# Patient Record
Sex: Male | Born: 1965 | Race: White | Hispanic: No | Marital: Married | Smoking: Never smoker
Health system: Southern US, Community
[De-identification: ages and names within clinical notes are randomized; demographics above are authoritative.]

## PROBLEM LIST (undated history)

## (undated) DIAGNOSIS — J329 Chronic sinusitis, unspecified: Secondary | ICD-10-CM

## (undated) DIAGNOSIS — J310 Chronic rhinitis: Secondary | ICD-10-CM

## (undated) DIAGNOSIS — R112 Nausea with vomiting, unspecified: Secondary | ICD-10-CM

## (undated) DIAGNOSIS — T8859XA Other complications of anesthesia, initial encounter: Secondary | ICD-10-CM

## (undated) DIAGNOSIS — R0683 Snoring: Secondary | ICD-10-CM

## (undated) DIAGNOSIS — Z9889 Other specified postprocedural states: Secondary | ICD-10-CM

## (undated) DIAGNOSIS — T4145XA Adverse effect of unspecified anesthetic, initial encounter: Secondary | ICD-10-CM

## (undated) DIAGNOSIS — T7840XA Allergy, unspecified, initial encounter: Secondary | ICD-10-CM

## (undated) HISTORY — PX: COLONOSCOPY: SHX174

---

## 1991-03-14 HISTORY — PX: NASAL SEPTUM SURGERY: SHX37

## 1998-03-13 HISTORY — PX: HERNIA REPAIR: SHX51

## 2017-12-24 ENCOUNTER — Other Ambulatory Visit: Payer: Self-pay | Admitting: Otolaryngology

## 2017-12-24 DIAGNOSIS — J329 Chronic sinusitis, unspecified: Secondary | ICD-10-CM

## 2017-12-28 ENCOUNTER — Ambulatory Visit
Admission: RE | Admit: 2017-12-28 | Discharge: 2017-12-28 | Disposition: A | Discharge status: Home / Self Care | Payer: 59 | Source: Ambulatory Visit | Attending: Otolaryngology | Admitting: Otolaryngology

## 2017-12-28 DIAGNOSIS — J329 Chronic sinusitis, unspecified: Secondary | ICD-10-CM

## 2018-01-09 ENCOUNTER — Other Ambulatory Visit: Payer: Self-pay

## 2018-01-09 ENCOUNTER — Encounter (HOSPITAL_BASED_OUTPATIENT_CLINIC_OR_DEPARTMENT_OTHER): Payer: Self-pay | Admitting: *Deleted

## 2018-01-15 ENCOUNTER — Ambulatory Visit: Payer: Self-pay | Admitting: Otolaryngology

## 2018-01-15 NOTE — H&P (Signed)
PREOPERATIVE H&P  Chief Complaint: frequent sinus infections and difficulty breathing through his nose especially on the left side  HPI: Ernest Calderon is a 52 y.o. male who presents for evaluation of frequent sinus infections and difficulty breathing through his nose especially on the left side.he generally has 4-5 infections per year.He had previous septoplasty performed in [place] 10 years ago.he recently had a CT scan that demonstrated mild septal deformity large turbinates and mild mucoperiosteal thickening within the sinuses. He is taken to the operating room at this time for turbinate reductions and FESS.  Past Medical History:  Diagnosis Date  . Allergies   . Chronic rhinitis   . Chronic sinusitis   . Complication of anesthesia   . PONV (postoperative nausea and vomiting)   . Snores    Past Surgical History:  Procedure Laterality Date  . COLONOSCOPY    . HERNIA REPAIR Bilateral 2000  . NASAL SEPTUM SURGERY  1993   Social History   Socioeconomic History  . Marital status: Married    Spouse name: Not on file  . Number of children: Not on file  . Years of education: Not on file  . Highest education level: Not on file  Occupational History  . Not on file  Social Needs  . Financial resource strain: Not on file  . Food insecurity:    Worry: Not on file    Inability: Not on file  . Transportation needs:    Medical: Not on file    Non-medical: Not on file  Tobacco Use  . Smoking status: Never Smoker  . Smokeless tobacco: Never Used  Substance and Sexual Activity  . Alcohol use: Never    Frequency: Never  . Drug use: Never  . Sexual activity: Not on file  Lifestyle  . Physical activity:    Days per week: Not on file    Minutes per session: Not on file  . Stress: Not on file  Relationships  . Social connections:    Talks on phone: Not on file    Gets together: Not on file    Attends religious service: Not on file    Active member of club or organization: Not on file     Attends meetings of clubs or organizations: Not on file    Relationship status: Not on file  Other Topics Concern  . Not on file  Social History Narrative  . Not on file   History reviewed. No pertinent family history. Allergies  Allergen Reactions  . Avelox [Moxifloxacin] Other (See Comments)    arthralgia  . Ibuprofen Swelling  . Levofloxacin Other (See Comments)    arthralgia  . Loracarbef Other (See Comments)    GI upset, made IBS worse  . Penicillins Rash    Can take Amoxicillin and Omnicef   Prior to Admission medications   Medication Sig Start Date End Date Taking? Authorizing Provider  Coenzyme Q10 (CO Q-10) 100 MG CAPS Take by mouth.   Yes [provider]  diphenhydrAMINE (BENADRYL) 25 mg capsule Take 25 mg by mouth every 6 (six) hours as needed.   Yes [provider]  loratadine (CLARITIN) 10 MG tablet Take 10 mg by mouth daily.   Yes [provider]  Red Yeast Rice 600 MG TABS Take by mouth.   Yes [provider]  Resveratrol 250 MG CAPS Take by mouth.   Yes [provider]     Positive ROS: per history of present illness  All other systems  have been reviewed and were otherwise negative with the exception of those mentioned in the HPI and as above.  Physical Exam: There were no vitals filed for this visit.  General: Alert, no acute distress Oral: Normal oral mucosa and tonsils Nasal: mild septal deformity to the left. Large inferior turbinates. No polyps noted. Neck: No palpable adenopathy or thyroid nodules Ear: Ear canal is clear with normal appearing TMs Cardiovascular: Regular rate and rhythm, no murmur.  Respiratory: Clear to auscultation Neurologic: Alert and oriented x 3   Assessment/Plan: CHRONIC RHINITIS,Turbinate hypertrophy. History of recurrent sinusitis. Plan for Procedure(s): BILATERAL TURBINATE REDUCTION ETHMOIDECTOMY MAXILLARY ENLARGEMENT   Dillard Cannon, MD 01/15/2018 5:09 PM

## 2018-01-18 ENCOUNTER — Encounter (HOSPITAL_BASED_OUTPATIENT_CLINIC_OR_DEPARTMENT_OTHER)
Admission: RE | Disposition: A | Discharge status: Home / Self Care | Payer: Self-pay | Source: Ambulatory Visit | Attending: Otolaryngology

## 2018-01-18 ENCOUNTER — Other Ambulatory Visit: Payer: Self-pay

## 2018-01-18 ENCOUNTER — Encounter (HOSPITAL_BASED_OUTPATIENT_CLINIC_OR_DEPARTMENT_OTHER): Payer: Self-pay | Admitting: Anesthesiology

## 2018-01-18 ENCOUNTER — Ambulatory Visit (HOSPITAL_BASED_OUTPATIENT_CLINIC_OR_DEPARTMENT_OTHER): Payer: 59 | Admitting: Anesthesiology

## 2018-01-18 ENCOUNTER — Ambulatory Visit (HOSPITAL_BASED_OUTPATIENT_CLINIC_OR_DEPARTMENT_OTHER)
Admission: RE | Admit: 2018-01-18 | Discharge: 2018-01-18 | Disposition: A | Discharge status: Home / Self Care | Payer: 59 | Source: Ambulatory Visit | Attending: Otolaryngology | Admitting: Otolaryngology

## 2018-01-18 DIAGNOSIS — J329 Chronic sinusitis, unspecified: Secondary | ICD-10-CM | POA: Diagnosis not present

## 2018-01-18 DIAGNOSIS — J343 Hypertrophy of nasal turbinates: Secondary | ICD-10-CM | POA: Diagnosis not present

## 2018-01-18 DIAGNOSIS — J310 Chronic rhinitis: Secondary | ICD-10-CM | POA: Diagnosis present

## 2018-01-18 DIAGNOSIS — Z79899 Other long term (current) drug therapy: Secondary | ICD-10-CM | POA: Diagnosis not present

## 2018-01-18 HISTORY — PX: ETHMOIDECTOMY: SHX5197

## 2018-01-18 HISTORY — PX: MAXILLARY ANTROSTOMY: SHX2003

## 2018-01-18 HISTORY — DX: Chronic rhinitis: J31.0

## 2018-01-18 HISTORY — DX: Other complications of anesthesia, initial encounter: T88.59XA

## 2018-01-18 HISTORY — DX: Other specified postprocedural states: Z98.890

## 2018-01-18 HISTORY — PX: TURBINATE REDUCTION: SHX6157

## 2018-01-18 HISTORY — DX: Adverse effect of unspecified anesthetic, initial encounter: T41.45XA

## 2018-01-18 HISTORY — DX: Chronic sinusitis, unspecified: J32.9

## 2018-01-18 HISTORY — DX: Allergy, unspecified, initial encounter: T78.40XA

## 2018-01-18 HISTORY — DX: Snoring: R06.83

## 2018-01-18 HISTORY — DX: Other specified postprocedural states: R11.2

## 2018-01-18 SURGERY — REDUCTION, NASAL TURBINATE
Anesthesia: General | Laterality: Bilateral

## 2018-01-18 MED ORDER — OXYMETAZOLINE HCL 0.05 % NA SOLN
NASAL | Status: DC | PRN
  Administered 2018-01-18: 1 via TOPICAL

## 2018-01-18 MED ORDER — OXYCODONE HCL 5 MG/5ML PO SOLN: 5.0000 mg | Freq: Once | ORAL | Status: DC | PRN

## 2018-01-18 MED ORDER — FENTANYL CITRATE (PF) 100 MCG/2ML IJ SOLN
INTRAMUSCULAR | Status: AC
  Filled 2018-01-18: qty 2

## 2018-01-18 MED ORDER — ROCURONIUM BROMIDE 50 MG/5ML IV SOSY
PREFILLED_SYRINGE | INTRAVENOUS | Status: AC
  Filled 2018-01-18: qty 5

## 2018-01-18 MED ORDER — SODIUM CHLORIDE 0.9 % IV SOLN
INTRAVENOUS | Status: AC | PRN
  Administered 2018-01-18: 500 mL via INTRAMUSCULAR

## 2018-01-18 MED ORDER — ONDANSETRON HCL 4 MG/2ML IJ SOLN
INTRAMUSCULAR | Status: AC
  Filled 2018-01-18: qty 2

## 2018-01-18 MED ORDER — ROCURONIUM BROMIDE 100 MG/10ML IV SOLN
INTRAVENOUS | Status: DC | PRN
  Administered 2018-01-18: 50 mg via INTRAVENOUS

## 2018-01-18 MED ORDER — LIDOCAINE HCL (CARDIAC) PF 100 MG/5ML IV SOSY
PREFILLED_SYRINGE | INTRAVENOUS | Status: DC | PRN
  Administered 2018-01-18: 100 mg via INTRAVENOUS

## 2018-01-18 MED ORDER — METHYLPREDNISOLONE ACETATE 80 MG/ML IJ SUSP
INTRAMUSCULAR | Status: AC
  Filled 2018-01-18: qty 1

## 2018-01-18 MED ORDER — PROMETHAZINE HCL 25 MG/ML IJ SOLN
INTRAMUSCULAR | Status: AC
  Filled 2018-01-18: qty 1

## 2018-01-18 MED ORDER — DEXAMETHASONE SODIUM PHOSPHATE 10 MG/ML IJ SOLN
INTRAMUSCULAR | Status: AC
  Filled 2018-01-18: qty 1

## 2018-01-18 MED ORDER — LIDOCAINE-EPINEPHRINE 1 %-1:100000 IJ SOLN
INTRAMUSCULAR | Status: AC
  Filled 2018-01-18: qty 1

## 2018-01-18 MED ORDER — MUPIROCIN CALCIUM 2 % EX CREA
TOPICAL_CREAM | CUTANEOUS | Status: AC
  Filled 2018-01-18: qty 15

## 2018-01-18 MED ORDER — ARTIFICIAL TEARS OPHTHALMIC OINT
TOPICAL_OINTMENT | OPHTHALMIC | Status: AC
  Filled 2018-01-18: qty 3.5

## 2018-01-18 MED ORDER — PROPOFOL 10 MG/ML IV BOLUS
INTRAVENOUS | Status: DC | PRN
  Administered 2018-01-18: 200 mg via INTRAVENOUS

## 2018-01-18 MED ORDER — MIDAZOLAM HCL 2 MG/2ML IJ SOLN
INTRAMUSCULAR | Status: AC
  Filled 2018-01-18: qty 2

## 2018-01-18 MED ORDER — FENTANYL CITRATE (PF) 100 MCG/2ML IJ SOLN: 50.0000 ug | INTRAMUSCULAR | Status: DC | PRN

## 2018-01-18 MED ORDER — CEFAZOLIN SODIUM-DEXTROSE 2-4 GM/100ML-% IV SOLN
INTRAVENOUS | Status: AC
  Filled 2018-01-18: qty 100

## 2018-01-18 MED ORDER — SCOPOLAMINE 1 MG/3DAYS TD PT72
1.0000 | MEDICATED_PATCH | Freq: Once | TRANSDERMAL | Status: AC | PRN
  Administered 2018-01-18: 1 via TRANSDERMAL

## 2018-01-18 MED ORDER — SCOPOLAMINE 1 MG/3DAYS TD PT72
MEDICATED_PATCH | TRANSDERMAL | Status: AC
  Filled 2018-01-18: qty 1

## 2018-01-18 MED ORDER — PHENYLEPHRINE HCL 10 MG/ML IJ SOLN
INTRAMUSCULAR | Status: DC | PRN
  Administered 2018-01-18: 120 ug via INTRAVENOUS

## 2018-01-18 MED ORDER — METHYLPREDNISOLONE ACETATE 80 MG/ML IJ SUSP
INTRAMUSCULAR | Status: DC | PRN
  Administered 2018-01-18: 80 mg

## 2018-01-18 MED ORDER — CEPHALEXIN 500 MG PO CAPS: 500.0000 mg | ORAL_CAPSULE | Freq: Two times a day (BID) | ORAL | 0 refills | Status: AC

## 2018-01-18 MED ORDER — LIDOCAINE 2% (20 MG/ML) 5 ML SYRINGE
INTRAMUSCULAR | Status: AC
  Filled 2018-01-18: qty 5

## 2018-01-18 MED ORDER — OXYCODONE HCL 5 MG PO TABS: 5.0000 mg | ORAL_TABLET | Freq: Once | ORAL | Status: DC | PRN

## 2018-01-18 MED ORDER — DEXAMETHASONE SODIUM PHOSPHATE 4 MG/ML IJ SOLN
INTRAMUSCULAR | Status: DC | PRN
  Administered 2018-01-18: 10 mg via INTRAVENOUS

## 2018-01-18 MED ORDER — CHLORHEXIDINE GLUCONATE CLOTH 2 % EX PADS: 6.0000 | MEDICATED_PAD | Freq: Once | CUTANEOUS | Status: DC

## 2018-01-18 MED ORDER — MIDAZOLAM HCL 2 MG/2ML IJ SOLN: 1.0000 mg | INTRAMUSCULAR | Status: DC | PRN

## 2018-01-18 MED ORDER — OXYMETAZOLINE HCL 0.05 % NA SOLN
NASAL | Status: AC
  Filled 2018-01-18: qty 15

## 2018-01-18 MED ORDER — HYDROMORPHONE HCL 1 MG/ML IJ SOLN: 0.2500 mg | INTRAMUSCULAR | Status: DC | PRN

## 2018-01-18 MED ORDER — ONDANSETRON HCL 4 MG/2ML IJ SOLN
INTRAMUSCULAR | Status: DC | PRN
  Administered 2018-01-18: 4 mg via INTRAVENOUS

## 2018-01-18 MED ORDER — LIDOCAINE-EPINEPHRINE 1 %-1:100000 IJ SOLN
INTRAMUSCULAR | Status: DC | PRN
  Administered 2018-01-18: 9 mL

## 2018-01-18 MED ORDER — LACTATED RINGERS IV SOLN
INTRAVENOUS | Status: DC
  Administered 2018-01-18 (×3): via INTRAVENOUS

## 2018-01-18 MED ORDER — SODIUM CHLORIDE (PF) 0.9 % IJ SOLN
INTRAMUSCULAR | Status: AC
  Filled 2018-01-18: qty 10

## 2018-01-18 MED ORDER — CEFAZOLIN SODIUM-DEXTROSE 2-4 GM/100ML-% IV SOLN
2.0000 g | INTRAVENOUS | Status: AC
  Administered 2018-01-18: 2 g via INTRAVENOUS

## 2018-01-18 MED ORDER — PROPOFOL 500 MG/50ML IV EMUL
INTRAVENOUS | Status: AC
  Filled 2018-01-18: qty 50

## 2018-01-18 MED ORDER — MUPIROCIN 2 % EX OINT
TOPICAL_OINTMENT | CUTANEOUS | Status: DC | PRN
  Administered 2018-01-18: 1 via NASAL

## 2018-01-18 MED ORDER — HYDROCODONE-ACETAMINOPHEN 5-325 MG PO TABS: 1.0000 | ORAL_TABLET | Freq: Four times a day (QID) | ORAL | 0 refills | Status: AC | PRN

## 2018-01-18 MED ORDER — LACTATED RINGERS IV SOLN: INTRAVENOUS | Status: DC

## 2018-01-18 MED ORDER — MIDAZOLAM HCL 5 MG/5ML IJ SOLN
INTRAMUSCULAR | Status: DC | PRN
  Administered 2018-01-18: 2 mg via INTRAVENOUS

## 2018-01-18 MED ORDER — FENTANYL CITRATE (PF) 100 MCG/2ML IJ SOLN
INTRAMUSCULAR | Status: DC | PRN
  Administered 2018-01-18 (×2): 25 ug via INTRAVENOUS
  Administered 2018-01-18: 100 ug via INTRAVENOUS
  Administered 2018-01-18: 50 ug via INTRAVENOUS

## 2018-01-18 MED ORDER — MEPERIDINE HCL 25 MG/ML IJ SOLN: 6.2500 mg | INTRAMUSCULAR | Status: DC | PRN

## 2018-01-18 MED ORDER — PROMETHAZINE HCL 25 MG/ML IJ SOLN
6.2500 mg | INTRAMUSCULAR | Status: DC | PRN
  Administered 2018-01-18: 6.25 mg via INTRAVENOUS

## 2018-01-18 SURGICAL SUPPLY — 66 items
APPLICATOR COTTON TIP 6 STRL (MISCELLANEOUS) IMPLANT
APPLICATOR COTTON TIP 6IN STRL (MISCELLANEOUS)
ATTRACTOMAT 16X20 MAGNETIC DRP (DRAPES) ×3 IMPLANT
BLADE INF TURB ROT M4 2 5PK (BLADE) ×2 IMPLANT
BLADE INF TURB ROT M4 2MM 5PK (BLADE) ×1
BLADE RAD40 ROTATE 4M 4 5PK (BLADE) IMPLANT
BLADE RAD40 ROTATE 4M 4MM 5PK (BLADE)
BLADE RAD60 ROTATE M4 4 5PK (BLADE) IMPLANT
BLADE RAD60 ROTATE M4 4MM 5PK (BLADE)
BLADE SURG 15 STRL LF DISP TIS (BLADE) ×1 IMPLANT
BLADE SURG 15 STRL SS (BLADE) ×2
BLADE TRICUT ROTATE M4 4 5PK (BLADE) ×2 IMPLANT
BLADE TRICUT ROTATE M4 4MM 5PK (BLADE) ×1
BLANKET WARM LOWER BOD BAIR (MISCELLANEOUS) ×3 IMPLANT
BUR HS RAD FRONTAL 3 (BURR) IMPLANT
BUR HS RAD FRONTAL 3MM (BURR)
CANISTER SUC SOCK COL 7IN (MISCELLANEOUS) IMPLANT
CANISTER SUCT 1200ML W/VALVE (MISCELLANEOUS) ×3 IMPLANT
CLEANER CAUTERY TIP 5X5 PAD (MISCELLANEOUS) IMPLANT
COAGULATOR SUCT 8FR VV (MISCELLANEOUS) ×3 IMPLANT
COVER MAYO STAND STRL (DRAPES) ×3 IMPLANT
COVER WAND RF STERILE (DRAPES) IMPLANT
DECANTER SPIKE VIAL GLASS SM (MISCELLANEOUS) IMPLANT
DRESSING ADAPTIC 1/2  N-ADH (PACKING) IMPLANT
DRESSING NASAL KENNEDY 3.5X.9 (MISCELLANEOUS) IMPLANT
DRSG NASAL KENNEDY 3.5X.9 (MISCELLANEOUS)
DRSG NASAL KENNEDY LMNT 8CM (GAUZE/BANDAGES/DRESSINGS) IMPLANT
DRSG NASOPORE 8CM (GAUZE/BANDAGES/DRESSINGS) IMPLANT
DRSG TELFA 3X8 NADH (GAUZE/BANDAGES/DRESSINGS) IMPLANT
ELECT COATED BLADE 2.86 ST (ELECTRODE) ×3 IMPLANT
ELECT REM PT RETURN 9FT ADLT (ELECTROSURGICAL) ×3
ELECTRODE REM PT RTRN 9FT ADLT (ELECTROSURGICAL) ×1 IMPLANT
GLOVE SS BIOGEL STRL SZ 7.5 (GLOVE) ×1 IMPLANT
GLOVE SUPERSENSE BIOGEL SZ 7.5 (GLOVE) ×2
GOWN STRL REIN XL XLG (GOWN DISPOSABLE) ×3 IMPLANT
GOWN STRL REUS W/ TWL LRG LVL3 (GOWN DISPOSABLE) ×2 IMPLANT
GOWN STRL REUS W/ TWL XL LVL3 (GOWN DISPOSABLE) ×1 IMPLANT
GOWN STRL REUS W/TWL LRG LVL3 (GOWN DISPOSABLE) ×4
GOWN STRL REUS W/TWL XL LVL3 (GOWN DISPOSABLE) ×2
HEMOSTAT SURGICEL .5X2 ABSORB (HEMOSTASIS) IMPLANT
HEMOSTAT SURGICEL 2X14 (HEMOSTASIS) IMPLANT
IV NS 500ML (IV SOLUTION) ×2
IV NS 500ML BAXH (IV SOLUTION) ×1 IMPLANT
NEEDLE PRECISIONGLIDE 27X1.5 (NEEDLE) ×6 IMPLANT
NEEDLE SPNL 25GX3.5 QUINCKE BL (NEEDLE) IMPLANT
NS IRRIG 1000ML POUR BTL (IV SOLUTION) ×3 IMPLANT
PACK BASIN DAY SURGERY FS (CUSTOM PROCEDURE TRAY) ×3 IMPLANT
PACK ENT DAY SURGERY (CUSTOM PROCEDURE TRAY) ×3 IMPLANT
PAD CLEANER CAUTERY TIP 5X5 (MISCELLANEOUS)
PATTIES SURGICAL .5 X3 (DISPOSABLE) ×3 IMPLANT
PENCIL BUTTON HOLSTER BLD 10FT (ELECTRODE) ×3 IMPLANT
SHEET MEDIUM DRAPE 40X70 STRL (DRAPES) ×3 IMPLANT
SLEEVE SCD COMPRESS KNEE MED (MISCELLANEOUS) ×3 IMPLANT
SOLUTION ANTI FOG 6CC (MISCELLANEOUS) ×3 IMPLANT
SPONGE GAUZE 2X2 8PLY STER LF (GAUZE/BANDAGES/DRESSINGS) ×1
SPONGE GAUZE 2X2 8PLY STRL LF (GAUZE/BANDAGES/DRESSINGS) ×2 IMPLANT
SUT CHROMIC 4 0 PS 2 18 (SUTURE) IMPLANT
SUT ETHILON 3 0 PS 1 (SUTURE) IMPLANT
SUT SILK 2 0 PERMA HAND 18 BK (SUTURE) IMPLANT
SYR 3ML 18GX1 1/2 (SYRINGE) ×3 IMPLANT
SYR CONTROL 10ML LL (SYRINGE) ×3 IMPLANT
TOWEL GREEN STERILE FF (TOWEL DISPOSABLE) ×6 IMPLANT
TRAY DSU PREP LF (CUSTOM PROCEDURE TRAY) ×3 IMPLANT
TUBE CONNECTING 20'X1/4 (TUBING) ×2
TUBE CONNECTING 20X1/4 (TUBING) ×4 IMPLANT
YANKAUER SUCT BULB TIP NO VENT (SUCTIONS) ×3 IMPLANT

## 2018-01-18 NOTE — Op Note (Signed)
NAME: Ernest Calderon, Ernest Calderon MEDICAL RECORD ZO:[id number] ACCOUNT [id number] DATE OF BIRTH:03/05/1966 FACILITY: [facility] LOCATION: [facility] PHYSICIAN:CHRISTOPHER Braxton Feathers, MD  OPERATIVE REPORT  DATE OF PROCEDURE:  01/18/2018  PREOPERATIVE DIAGNOSES:  Chronic rhinitis with turbinate hypertrophy and recurrent sinusitis.  POSTOPERATIVE DIAGNOSES:  Chronic rhinitis with turbinate hypertrophy and recurrent sinusitis.  OPERATIONS PERFORMED: 1.  Bilateral inferior turbinate reductions with Medtronic turbinate blade.   2.  Functional endoscopic sinus surgery with bilateral anterior ethmoidectomy and bilateral maxillary ostium enlargement.  SURGEON:  Dillard Cannon, MD  ANESTHESIA:  General endotracheal.  COMPLICATIONS:  None.  ESTIMATED BLOOD LOSS:  80 mL.  BRIEF CLINICAL NOTE:  The patient  is a 52 year old gentleman who has had a previous surgery with septoplasty and turbinate reductions performed 10 years ago in [place].  He has had progressive nasal obstruction as well as history of recurrent sinus  infections.  He estimates he gets sinus infections 4 to 5 times a year.  He has trouble breathing through his nose, especially on the left side.  On exam and CT scan, he had large turbinates with a diffuse rhinitis.  He had mild mucoperiosteal thickening  within the ethmoid and maxillary sinuses.  He is taken to the operating room at this time for turbinate reductions as well as limited functional endoscopic sinus surgery.  DESCRIPTION OF PROCEDURE:  After adequate endotracheal anesthesia, the patient received 2 grams of Ancef IV preoperatively as well as 10 mg of Decadron.  The nose and face was prepped with Betadine solution and draped in sterile towels.  The nose was  then examined and prepped with a cotton pledget soaked in Afrin to decongest the nose.  The inferior turbinates and middle turbinate region was injected with Xylocaine with epinephrine for hemostasis.  First, the right side was  approached.  The middle  turbinate was fractured medially.  Uncinate process was incised sharply with a sickle knife and removed.  The maxillary ostia was identified on the right side and was enlarged with backbiting and straight Tru-Cut forceps to approximately 1 cm size.  The  maxillary sinus was relatively clear on exam with a 30-degree endoscope.  Anterior ethmoid cells were opened up with a straight through cup and up cutting forceps and microdebrider.  This completed the right side.  On the left side,  a similar approach  was performed.  The middle turbinate was displaced medially.  Uncinate process was incised with a sickle knife.  The anterior and a few of the posterior ethmoid cells were opened up with a straight Tru-Cut forceps as well as a microdebrider.  Maxillary  ostia was identified and was enlarged with backbiting and straight Tru-Cut forceps.  This was enlarged slightly larger than the right side to approximately 1-1/4 cm.  This completed the sinus procedure on the left side.  Following completion of the sinus  procedures, the Medtronic turbinate blade was used to perform submucosal reduction of the turbinate tissue bilaterally.  The turbinates were outfractured and hemostasis was obtained with suction cautery.  This completed the procedure.  The middle meatus  region was packed with a Nasopore soaked in mupirocin ointment bilaterally.  In addition, 80 mg of Depo-Medrol were injected predominantly more on the left side than the right.    The patient was awoken from anesthesia and transferred to recovery room  postoperatively doing well.  DISPOSITION:  The patient will be discharged home later this morning on Keflex 500 mg b.i.d. for 1 week.  Tylenol, ibuprofen and hydrocodone p.r.n.  pain.  He will have follow up in my office in 5 to 7 days for recheck.  AN/NUANCE  D:01/18/2018 T:01/18/2018 JOB:[id number]

## 2018-01-18 NOTE — Anesthesia Postprocedure Evaluation (Signed)
Anesthesia Post Note  Patient: Ernest Calderon  Procedure(s) Performed: BILATERAL TURBINATE REDUCTION (Bilateral ) ETHMOIDECTOMY (Bilateral ) MAXILLARY ENLARGEMENT (Bilateral )     Patient location during evaluation: PACU Anesthesia Type: General Level of consciousness: awake and alert Pain management: pain level controlled Vital Signs Assessment: post-procedure vital signs reviewed and stable Respiratory status: spontaneous breathing, nonlabored ventilation, respiratory function stable and patient connected to nasal cannula oxygen Cardiovascular status: blood pressure returned to baseline and stable Postop Assessment: no apparent nausea or vomiting Anesthetic complications: no    Last Vitals:  Vitals:   01/18/18 1115 01/18/18 1130  BP: 116/82 124/73  Pulse: 83 84  Resp: 15 18  Temp:  36.7 C  SpO2: 94% 95%    Last Pain:  Vitals:   01/18/18 1130  TempSrc:   PainSc: 0-No pain                 Shelton Silvas

## 2018-01-18 NOTE — Anesthesia Postprocedure Evaluation (Deleted)
Anesthesia Post Note  Patient: Ernest Calderon  Procedure(s) Performed: BILATERAL TURBINATE REDUCTION (Bilateral ) ETHMOIDECTOMY (Bilateral ) MAXILLARY ENLARGEMENT (Bilateral )     Patient location during evaluation: PACU Anesthesia Type: General Level of consciousness: awake and alert Pain management: pain level controlled Vital Signs Assessment: post-procedure vital signs reviewed and stable Respiratory status: spontaneous breathing, nonlabored ventilation, respiratory function stable and patient connected to nasal cannula oxygen Cardiovascular status: blood pressure returned to baseline and stable Postop Assessment: no apparent nausea or vomiting Anesthetic complications: no    Last Vitals:  Vitals:   01/18/18 1050 01/18/18 1100  BP:  121/87  Pulse: 82 94  Resp: 13 15  Temp:    SpO2: 99% 93%    Last Pain:  Vitals:   01/18/18 1100  TempSrc:   PainSc: 0-No pain                 Shelton Silvas

## 2018-01-18 NOTE — Interval H&P Note (Signed)
History and Physical Interval Note:  01/18/2018 7:30 AM  [name]  has presented today for surgery, with the diagnosis of CHRONIC RHINITIS,DEVIATED SEPTUM  The various methods of treatment have been discussed with the patient and family. After consideration of risks, benefits and other options for treatment, the patient has consented to  Procedure(s): BILATERAL TURBINATE REDUCTION (Bilateral) ETHMOIDECTOMY (Bilateral) MAXILLARY ENLARGEMENT (Bilateral) as a surgical intervention .  The patient's history has been reviewed, patient examined, no change in status, stable for surgery.  I have reviewed the patient's chart and labs.  Questions were answered to the patient's satisfaction.     Ernest Calderon

## 2018-01-18 NOTE — Anesthesia Preprocedure Evaluation (Addendum)
Anesthesia Evaluation  Patient identified by MRN, date of birth, ID band Patient awake    Reviewed: Allergy & Precautions, NPO status , Patient's Chart, lab work & pertinent test results  History of Anesthesia Complications (+) PONV  Airway Mallampati: I  TM Distance: >3 FB Neck ROM: Full    Dental  (+) Teeth Intact, Dental Advisory Given   Pulmonary neg pulmonary ROS,    Pulmonary exam normal        Cardiovascular negative cardio ROS   Rhythm:Regular Rate:Normal     Neuro/Psych negative neurological ROS     GI/Hepatic negative GI ROS, Neg liver ROS,   Endo/Other  negative endocrine ROS  Renal/GU negative Renal ROS     Musculoskeletal negative musculoskeletal ROS (+)   Abdominal Normal abdominal exam  (+)   Peds  Hematology negative hematology ROS (+)   Anesthesia Other Findings   Reproductive/Obstetrics                            Anesthesia Physical Anesthesia Plan  ASA: II  Anesthesia Plan: General   Post-op Pain Management:    Induction: Intravenous  PONV Risk Score and Plan: 4 or greater and Ondansetron, Dexamethasone, Midazolam and Scopolamine patch - Pre-op  Airway Management Planned: Oral ETT  Additional Equipment: None  Intra-op Plan:   Post-operative Plan: Extubation in OR  Informed Consent: I have reviewed the patients History and Physical, chart, labs and discussed the procedure including the risks, benefits and alternatives for the proposed anesthesia with the patient or authorized representative who has indicated his/her understanding and acceptance.   Dental advisory given  Plan Discussed with: CRNA  Anesthesia Plan Comments:        Anesthesia Quick Evaluation

## 2018-01-18 NOTE — Anesthesia Procedure Notes (Signed)
Procedure Name: Intubation Date/Time: 01/18/2018 7:44 AM Performed by: Gar Gibbon, CRNA Pre-anesthesia Checklist: Patient identified, Emergency Drugs available, Suction available and Patient being monitored Patient Re-evaluated:Patient Re-evaluated prior to induction Oxygen Delivery Method: Circle system utilized Preoxygenation: Pre-oxygenation with 100% oxygen Induction Type: IV induction Ventilation: Mask ventilation without difficulty Laryngoscope Size: Miller and 3 Grade View: Grade II Tube type: Oral Rae Number of attempts: 1 Airway Equipment and Method: Stylet and Oral airway Placement Confirmation: ETT inserted through vocal cords under direct vision,  positive ETCO2 and breath sounds checked- equal and bilateral Secured at: 22 cm Tube secured with: Tape Dental Injury: Teeth and Oropharynx as per pre-operative assessment

## 2018-01-18 NOTE — Brief Op Note (Signed)
01/18/2018  10:05 AM  PATIENT:  Ernest Calderon  52 y.o. male  PRE-OPERATIVE DIAGNOSIS:  CHRONIC RHINITIS,TURBINATE HYPERTROPHY,RECURRENT SINUSITUS  POST-OPERATIVE DIAGNOSIS:  CHRONIC RHINITIS,TURBINATE HYPERTROPHY,RECURRENT SINUSITUS  PROCEDURE:  Procedure(s): BILATERAL TURBINATE REDUCTION (Bilateral) ETHMOIDECTOMY (Bilateral) MAXILLARY ENLARGEMENT (Bilateral)  SURGEON:  Surgeon(s) and Role:    Drema Halon, MD - Primary  PHYSICIAN ASSISTANT:   ASSISTANTS: none   ANESTHESIA:   general  EBL:  80 cc   BLOOD ADMINISTERED:none  DRAINS: none   LOCAL MEDICATIONS USED:  XYLOCAINE with EPI 10 cc  SPECIMEN:  No Specimen  DISPOSITION OF SPECIMEN:  N/A  COUNTS:  YES  TOURNIQUET:  * No tourniquets in log *  DICTATION: .Other Dictation: Dictation Number [id number]  PLAN OF CARE: Discharge to home after PACU  PATIENT DISPOSITION:  PACU - hemodynamically stable.   Delay start of Pharmacological VTE agent (>24hrs) due to surgical blood loss or risk of bleeding: yes

## 2018-01-18 NOTE — Discharge Instructions (Addendum)
Keflex 500 mg bid for the next week Use saline rinse to clean the nose daily Use cold compress to the nose to reduce swelling and bleeding Call office for follow up appt in 5- 7 days     [phone] Tylenol, or hydrocodone 5 mg 1-2 every 6 hrs prn pain    Post Anesthesia Home Care Instructions  Activity: Get plenty of rest for the remainder of the day. A responsible individual must stay with you for 24 hours following the procedure.  For the next 24 hours, DO NOT: -Drive a car -Advertising copywriter -Drink alcoholic beverages -Take any medication unless instructed by your physician -Make any legal decisions or sign important papers.  Meals: Start with liquid foods such as gelatin or soup. Progress to regular foods as tolerated. Avoid greasy, spicy, heavy foods. If nausea and/or vomiting occur, drink only clear liquids until the nausea and/or vomiting subsides. Call your physician if vomiting continues.  Special Instructions/Symptoms: Your throat may feel dry or sore from the anesthesia or the breathing tube placed in your throat during surgery. If this causes discomfort, gargle with warm salt water. The discomfort should disappear within 24 hours.  If you had a scopolamine patch placed behind your ear for the management of post- operative nausea and/or vomiting:  1. The medication in the patch is effective for 72 hours, after which it should be removed.  Wrap patch in a tissue and discard in the trash. Wash hands thoroughly with soap and water. 2. You may remove the patch earlier than 72 hours if you experience unpleasant side effects which may include dry mouth, dizziness or visual disturbances. 3. Avoid touching the patch. Wash your hands with soap and water after contact with the patch.

## 2018-01-18 NOTE — Transfer of Care (Signed)
Immediate Anesthesia Transfer of Care Note  Patient: Ernest Calderon  Procedure(s) Performed: BILATERAL TURBINATE REDUCTION (Bilateral ) ETHMOIDECTOMY (Bilateral ) MAXILLARY ENLARGEMENT (Bilateral )  Patient Location: PACU  Anesthesia Type:General  Level of Consciousness: awake, sedated and patient cooperative  Airway & Oxygen Therapy: Patient Spontanous Breathing and aerosol face mask  Post-op Assessment: Report given to RN and Post -op Vital signs reviewed and stable  Post vital signs: Reviewed and stable  Last Vitals:  Vitals Value Taken Time  BP    Temp    Pulse 92 01/18/2018 10:32 AM  Resp 14 01/18/2018 10:32 AM  SpO2 95 % 01/18/2018 10:32 AM  Vitals shown include unvalidated device data.  Last Pain:  Vitals:   01/18/18 0645  TempSrc: Oral         Complications: No apparent anesthesia complications

## 2018-01-21 ENCOUNTER — Encounter (HOSPITAL_BASED_OUTPATIENT_CLINIC_OR_DEPARTMENT_OTHER): Payer: Self-pay | Admitting: Otolaryngology

## 2020-03-23 IMAGING — CT CT MAXILLOFACIAL W/O CM
3 of 4 series · 13 of 47 positions shown, 15 images · non-contrast
Comparison: None.

CLINICAL DATA: Chronic sinusitis. Prior septoplasty.

EXAM:
CT MAXILLOFACIAL WITHOUT CONTRAST
TECHNIQUE: Multidetector CT images of the paranasal sinuses were obtained using
the standard protocol without intravenous contrast.

[Series 2: sinus 2.00 hr60 s3 ax · axial · 0.33mm/px · z∈[-633,-477]mm · 7 of 104 slices shown, 9 images]
[im 13/104  brain]
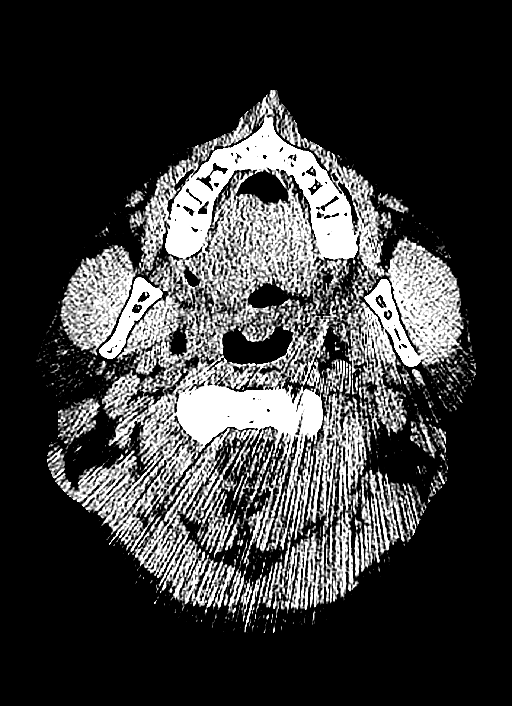
[im 13/104  bone]
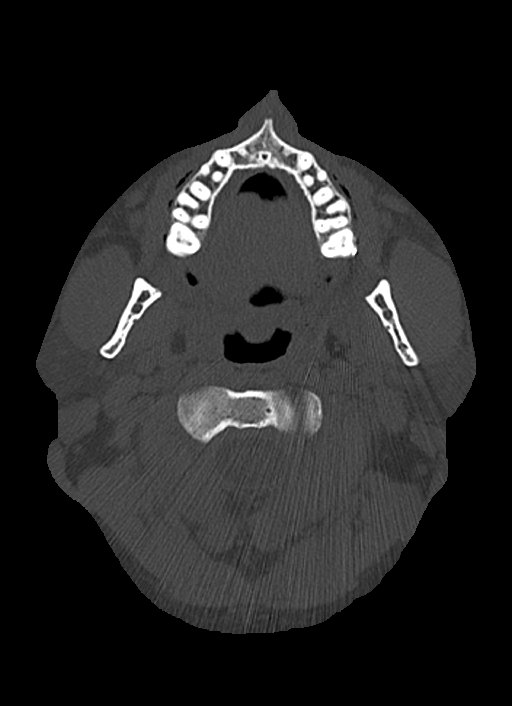
[im 25/104  bone]
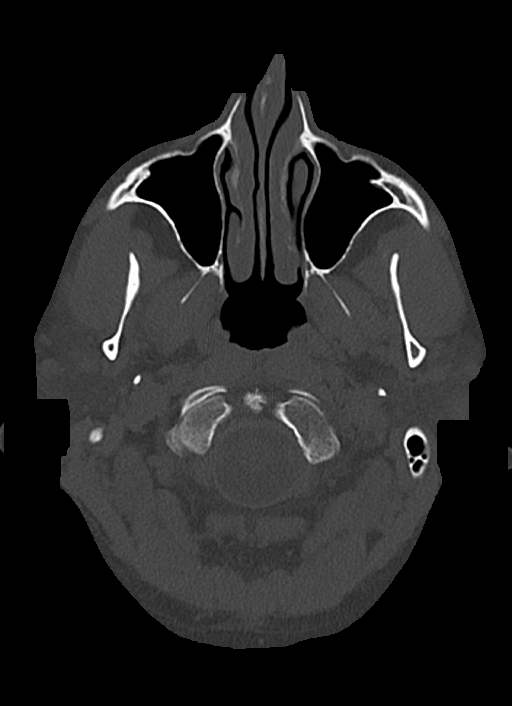
[im 37/104  bone]
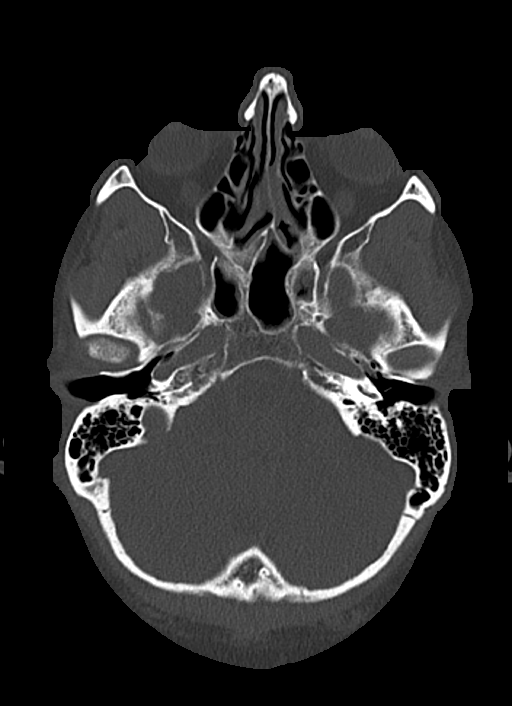
[im 55/104  bone]
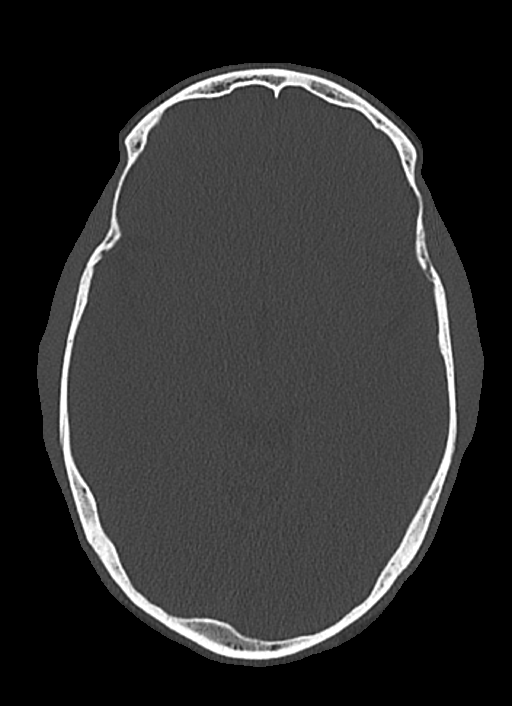
[im 67/104  brain]
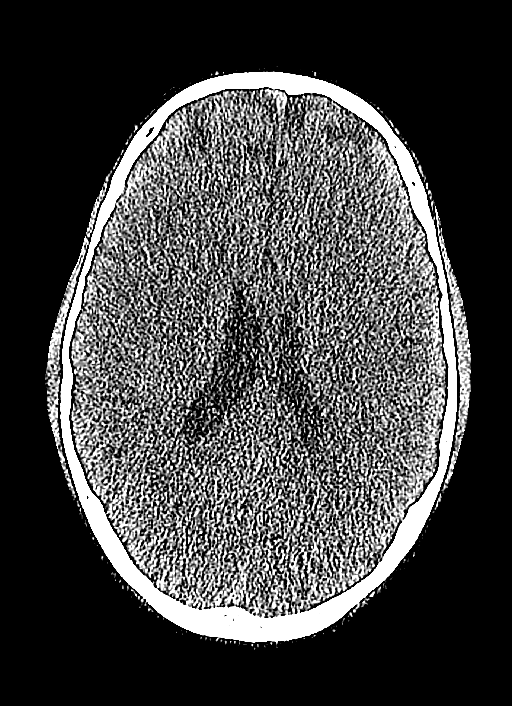
[im 67/104  bone]
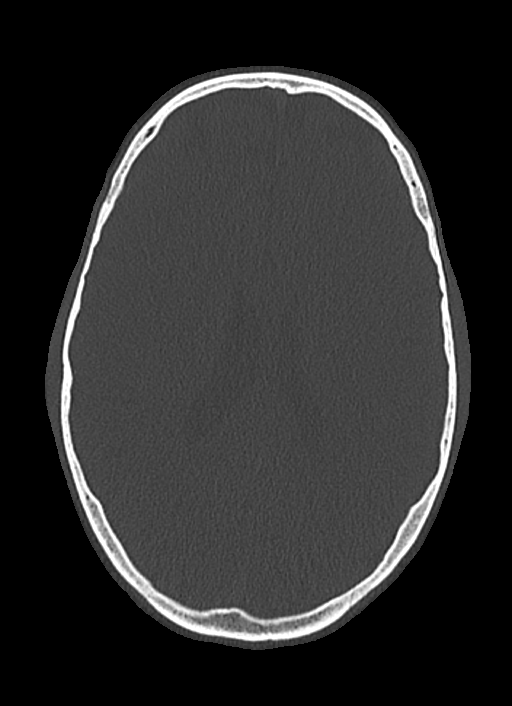
[im 79/104  bone]
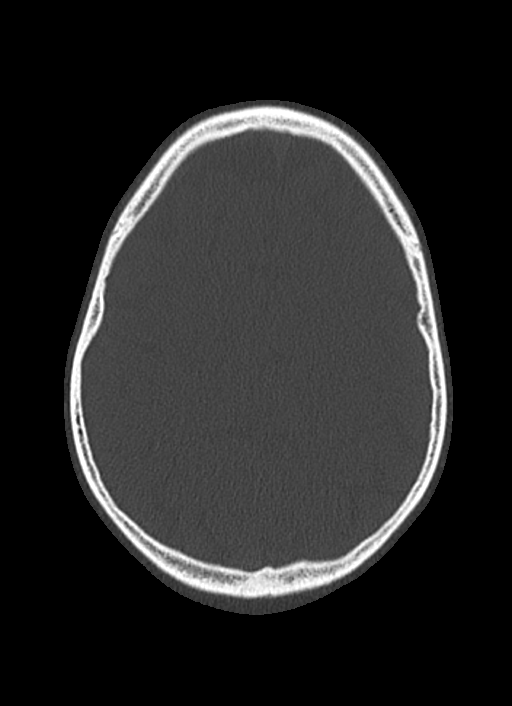
[im 91/104  bone]
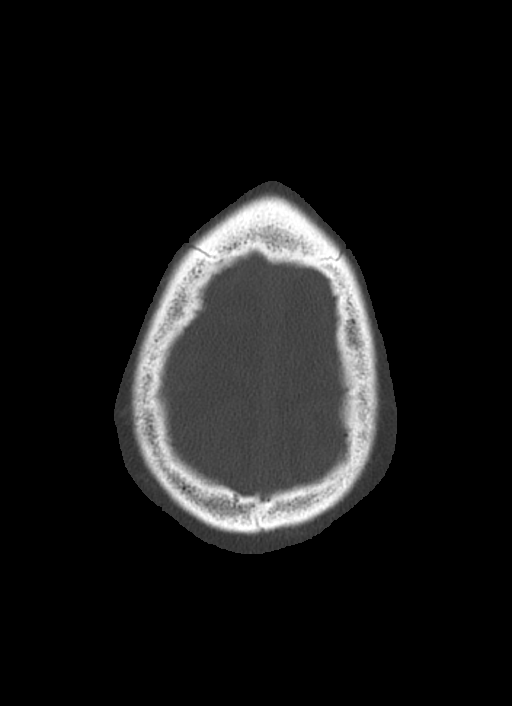

[Series 4: sinus 2.00 hr60 s3 cor · coronal · 0.33mm/px · 3 of 117 slices shown]
[im 39/117  bone]
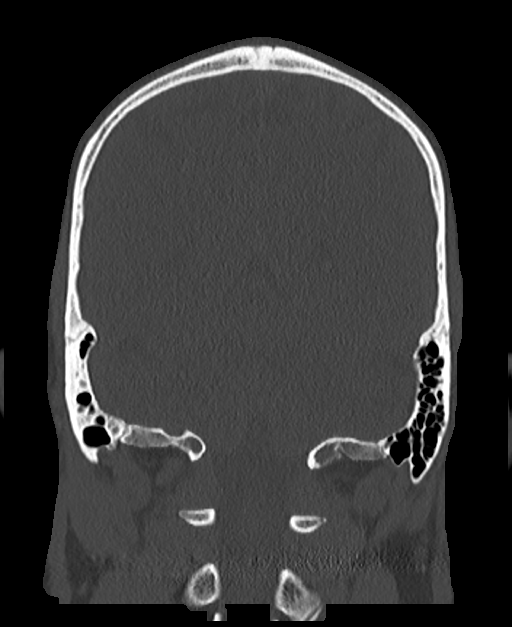
[im 52/117  bone]
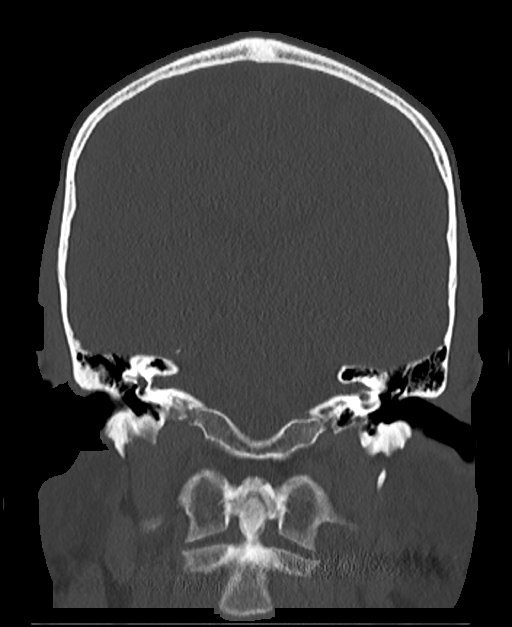
[im 65/117  bone]
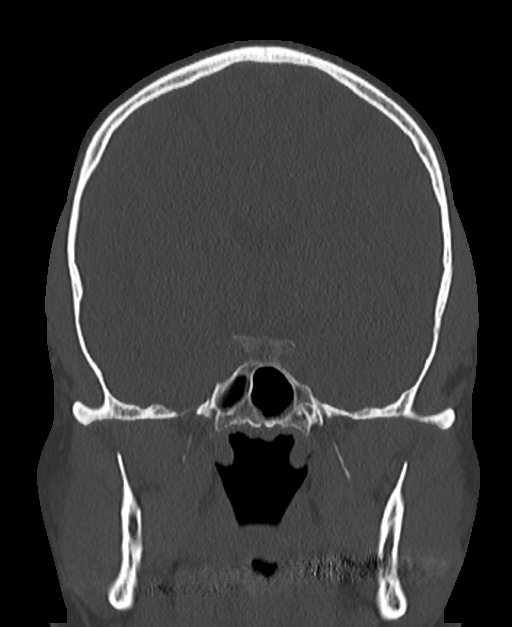

[Series 6: sinus 2.00 hr60 s3 sag · sagittal · 0.41mm/px · 3 of 85 slices shown]
[im 29/85  bone]
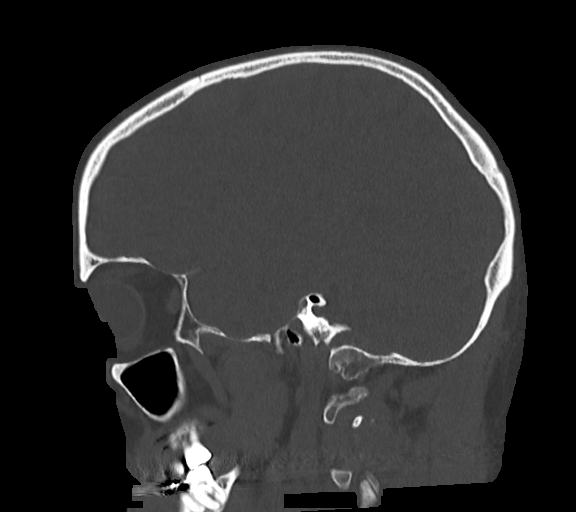
[im 43/85  bone]
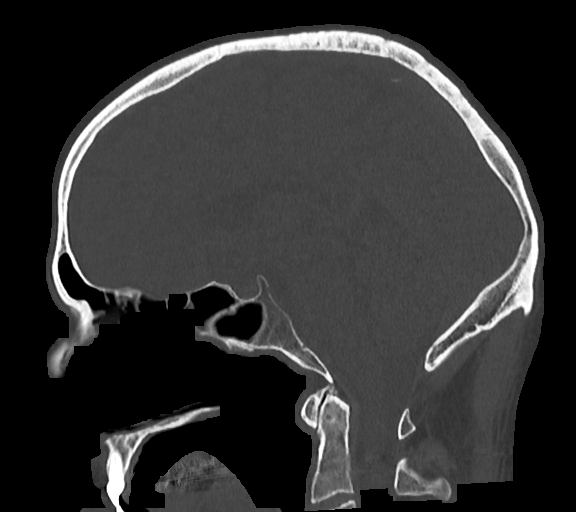
[im 57/85  bone]
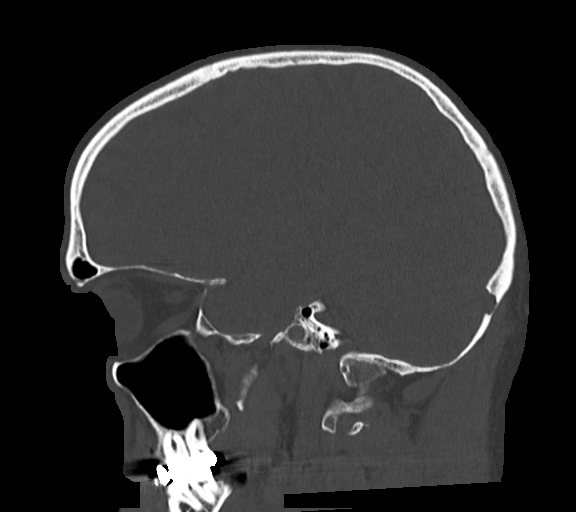

[13 of 47 positions shown; findings below may reference images not displayed]

FINDINGS: Paranasal sinuses:

Frontal: Trace mucosal thickening near the frontal sinus ostia,
otherwise clear.

Ethmoid: Mild ethmoid air cell mucosal thickening bilaterally, left
greater than right.

Maxillary: Mild focal mucosal thickening inferiorly in the right
maxillary sinus. Clear left maxillary sinus.

Sphenoid: Mild mucosal thickening bilaterally effacing the ostia.

Right ostiomeatal unit: Patent.

Left ostiomeatal unit: Patent.

Nasal passages: Patent. Intact nasal septum is midline.

Anatomy: No pneumatization superior to anterior ethmoid notches.
Symmetric and intact olfactory grooves and fovea ethmoidalis, Keros
II. Sellar sphenoid pneumatization pattern. No dehiscence of carotid
or optic canals. No onodi cell.

Other: Clear mastoid air cells and tympanic cavities. Grossly
unremarkable appearance of the brain and orbits.
IMPRESSION: Mild paranasal sinus mucosal thickening. No fluid.

## 2020-04-13 DEATH — deceased
